# Patient Record
Sex: Male | Born: 1960 | Race: White | Hispanic: No | Marital: Married | Smoking: Never smoker
Health system: Southern US, Community
[De-identification: ages and names within clinical notes are randomized; demographics above are authoritative.]

---

## 1982-04-05 HISTORY — PX: KNEE SURGERY: SHX244

## 2006-04-14 ENCOUNTER — Encounter: Payer: Self-pay | Admitting: Internal Medicine

## 2006-04-27 ENCOUNTER — Encounter: Payer: Self-pay | Admitting: Internal Medicine

## 2007-08-30 ENCOUNTER — Encounter: Payer: Self-pay | Admitting: Internal Medicine

## 2008-10-21 ENCOUNTER — Encounter: Payer: Self-pay | Admitting: Internal Medicine

## 2008-10-24 ENCOUNTER — Encounter: Payer: Self-pay | Admitting: Internal Medicine

## 2009-11-21 ENCOUNTER — Encounter: Payer: Self-pay | Admitting: Internal Medicine

## 2009-11-25 ENCOUNTER — Encounter (INDEPENDENT_AMBULATORY_CARE_PROVIDER_SITE_OTHER): Payer: Self-pay | Admitting: *Deleted

## 2009-11-25 ENCOUNTER — Ambulatory Visit: Payer: Self-pay | Admitting: Internal Medicine

## 2009-11-25 DIAGNOSIS — I472 Ventricular tachycardia: Secondary | ICD-10-CM

## 2009-11-25 DIAGNOSIS — R6884 Jaw pain: Secondary | ICD-10-CM | POA: Insufficient documentation

## 2009-11-27 ENCOUNTER — Telehealth (INDEPENDENT_AMBULATORY_CARE_PROVIDER_SITE_OTHER): Payer: Self-pay | Admitting: *Deleted

## 2009-11-28 LAB — CONVERTED CEMR LAB
BUN: 14 mg/dL (ref 6–23)
CO2: 25 meq/L (ref 19–32)
CRP, High Sensitivity: 0.71 (ref 0.00–5.00)
Chloride: 108 meq/L (ref 96–112)
Creatinine, Ser: 0.9 mg/dL (ref 0.4–1.5)

## 2009-12-10 ENCOUNTER — Ambulatory Visit (HOSPITAL_COMMUNITY): Admission: RE | Admit: 2009-12-10 | Discharge: 2009-12-10 | Payer: Self-pay | Admitting: Cardiovascular Disease

## 2009-12-10 ENCOUNTER — Ambulatory Visit: Payer: Self-pay | Admitting: Cardiovascular Disease

## 2009-12-10 ENCOUNTER — Ambulatory Visit: Payer: Self-pay

## 2009-12-10 ENCOUNTER — Ambulatory Visit: Payer: Self-pay | Admitting: Internal Medicine

## 2009-12-18 ENCOUNTER — Telehealth: Payer: Self-pay | Admitting: Internal Medicine

## 2009-12-18 ENCOUNTER — Ambulatory Visit: Payer: Self-pay | Admitting: Internal Medicine

## 2009-12-23 ENCOUNTER — Encounter: Payer: Self-pay | Admitting: Internal Medicine

## 2009-12-23 LAB — CONVERTED CEMR LAB
BUN: 12 mg/dL (ref 6–23)
Bilirubin, Direct: 0.2 mg/dL (ref 0.0–0.3)
CO2: 28 meq/L (ref 19–32)
Calcium: 9.2 mg/dL (ref 8.4–10.5)
Cholesterol: 183 mg/dL (ref 0–200)
Creatinine, Ser: 0.8 mg/dL (ref 0.4–1.5)
HDL: 32.5 mg/dL — ABNORMAL LOW (ref >39.00)
LDL Cholesterol: 128 mg/dL — ABNORMAL HIGH (ref 0–99)
Total CHOL/HDL Ratio: 6
Total Protein: 7.1 g/dL (ref 6.0–8.3)
Triglycerides: 115 mg/dL (ref 0.0–149.0)

## 2010-02-18 ENCOUNTER — Encounter: Payer: Self-pay | Admitting: Internal Medicine

## 2010-02-18 ENCOUNTER — Ambulatory Visit: Payer: Self-pay | Admitting: Internal Medicine

## 2010-03-17 ENCOUNTER — Encounter: Payer: Self-pay | Admitting: Internal Medicine

## 2010-05-05 NOTE — Letter (Signed)
Summary: [facility]  [facility]   Imported By: Marylou Mccoy 12/25/2009 16:43:51  _____________________________________________________________________  External Attachment:    Type:   Image     Comment:   External Document

## 2010-05-05 NOTE — Letter (Signed)
Summary: Custom - Lipid  [facility], Main Office  [address]   [place], [place] [address]   Phone: [phone]  Fax: [phone]     December 23, 2009 MRN: [id number]   [facility] [address] [place], [place]  [address]   Dear James Stone,  We have reviewed your cholesterol results.  They are as follows:     Total Cholesterol:    183 (Desirable: less than 200)       HDL  Cholesterol:     32.50  (Desirable: greater than 40 for men and 50 for women)       LDL Cholesterol:       128  (Desirable: less than 100 for low risk and less than 70 for moderate to high risk)       Triglycerides:       115.0  (Desirable: less than 150)  Our recommendations include:  Looks ok, since you have just started on Crestor we will contact you in about 3 months to recheck labs.   Call our office at the number listed above if you have any questions.  Lowering your LDL cholesterol is important, but it is only one of a large number of "risk factors" that may indicate that you are at risk for heart disease, stroke or other complications of hardening of the arteries.  Other risk factors include:   A.  Cigarette Smoking* B.  High Blood Pressure* C.  Obesity* D.   Low HDL Cholesterol (see yours above)* E.   Diabetes Mellitus (higher risk if your is uncontrolled) F.  Family history of premature heart disease G.  Previous history of stroke or cardiovascular disease    *These are risk factors YOU HAVE CONTROL OVER.  For more information, visit .  There is now evidence that lowering the TOTAL CHOLESTEROL AND LDL CHOLESTEROL can reduce the risk of heart disease.  The American Heart Association recommends the following guidelines for the treatment of elevated cholesterol:  1.  If there is now current heart disease and less than two risk factors, TOTAL CHOLESTEROL should be less than 200 and LDL CHOLESTEROL should be less than 100. 2.  If there is current heart disease or two  or more risk factors, TOTAL CHOLESTEROL should be less than 200 and LDL CHOLESTEROL should be less than 70.  A diet low in cholesterol, saturated fat, and calories is the cornerstone of treatment for elevated cholesterol.  Cessation of smoking and exercise are also important in the management of elevated cholesterol and preventing vascular disease.  Studies have shown that 30 to 60 minutes of physical activity most days can help lower blood pressure, lower cholesterol, and keep your weight at a healthy level.  Drug therapy is used when cholesterol levels do not respond to therapeutic lifestyle changes (smoking cessation, diet, and exercise) and remains unacceptably high.  If medication is started, it is important to have you levels checked periodically to evaluate the need for further treatment options.  Thank you,    [employer] Team

## 2010-05-05 NOTE — Progress Notes (Addendum)
  Phone Note Outgoing Call   Details for Reason: Promise study Summary of Call: Pt was notified that coronary CTA was scheduled for 12/10/2009. Pt was given instructions and verbalized understanding.

## 2010-05-05 NOTE — Assessment & Plan Note (Signed)
Summary: James Stone   Visit Type:  Follow-up Primary Provider:  DR James Stone  CC:  no complaints.  History of Present Illness: James Stone is a 50 y/o Stone with a h/o hyperlipidemia, erectile dysfunction and exertional non-sustained ventricular tachycardia. Referred to me by James Stone for 2nd opinion.    Had ETT in 04/14/2006 with his PCP which was negative except for 3 short runs (5-6 beats) of NSVT. Started on Atenolol. Referred to James Stone (Dr. Deeann Stone) and had a follow-up Adenosine Myoview in 1/08 at [facility]. EF 64%. No ischemia or infarct. He continued to have paroxysms of exertion throat discomfort and was offered a cardiac cath but has never pursue this.   Over past few months has had normal stress test (11:30 on Bruce) with PVCs. No NSVT. No ST-T abnormalities. Also had coronary CT which showed:  1)    EF 54% No RWMAs 2)    Right dominant circulation with no significant stenosis. Less than 30% calcified plaque in proximal circumflex  Doing great. Exercise as permitted with his plantar fasciitis. No CP or SOB. Stopped Crestor due to price.     Current Medications (verified): 1)  Vitamin C 500 Mg  Tabs (Ascorbic Acid) .Marland Kitchen.. 1 Tab Once Daily 2)  Fish Oil   Oil (Fish Oil) .Marland Kitchen.. 1 Tab Two Times A Day 3)  Ibuprofen 200 Mg Tabs (Ibuprofen) .... 4 Tabs Od 4)  Viagra 100 Mg Tabs (Sildenafil Citrate) .... As Needed  Allergies (verified): No Known Drug Allergies  Past History:  Past Medical History: 1) Brief runs of NSVT on ETT 1/08     --f/u adenosine myoview 1/08. EF normal. no ischemia 2) FHx CAD 3) Erectile dysfunction 4) Cardiac CT 9/11:      --EF 54% No RWMAs     -- Right dominant circulation with no significant stenosis.        Less than 30% calcified plaque in proximal circumflex    --ETT: Bruce 11:30 no ST changes. PVCs. no NSVT       Review of Systems       As per HPI and past medical history; otherwise all systems negative.   Vital Signs:  Patient  profile:   50 year old Stone Height:      76 inches Weight:      232 pounds BMI:     28.34 Pulse rate:   57 / minute BP sitting:   108 / 70  (left arm) Cuff size:   regular  Vitals Entered By: James Stone, RMA (February 18, 2010 11:13 AM)  Physical Exam  General:  Well appearing. no resp difficulty HEENT: normal Neck: supple. no JVD. Carotids 2+ bilat; no bruits. No lymphadenopathy or thryomegaly appreciated. Cor: PMI nondisplaced. Regular rate & rhythm. No rubs, gallops, murmur. Lungs: clear Abdomen: soft, nontender, nondistended. No hepatosplenomegaly. No bruits or masses. Good bowel sounds. Extremities: no cyanosis, clubbing, rash, edema Neuro: alert & orientedx3, cranial nerves grossly intact. moves all 4 extremities w/o difficulty. affect pleasant    Impression & Recommendations:  Problem # 1:  Chest pain/NSVT Stress testing and cardiac CT look great. Reassured him. No further testing at this point.   Problem # 2:  Hyperlipidemia LDL just mildly elevated. Will switch Crestor to simva 20.   Other Orders: EKG w/ Interpretation (93000)  Patient Instructions: 1)  Your physician recommends that you schedule a follow-up appointment in: 12 months 2)  Your physician recommends that you return for a FASTING lipid profile, liver profile and  complete metabolic profile in 4 months--March 2012. 3)  Your physician has recommended you make the following change in your medication: Stop Crestor  Appended Document: Clawson Stone Per Dr. Gala Stone pt to start on Simvastatin 20 mg daily. I spoke with pt and he is aware of this. [facility] [place] [facility].   Clinical Lists Changes  Medications: Added new medication of SIMVASTATIN 20 MG TABS (SIMVASTATIN) Take one tablet by mouth daily at bedtime - Signed Rx of SIMVASTATIN 20 MG TABS (SIMVASTATIN) Take one tablet by mouth daily at bedtime;  #30 x 11;  Signed;  Entered by: James Arbour, RN, BSN;   Authorized by: James Patty, MD, [facility];  Method used: [facility]  [phone] [address], [place], [place]  [address], Ph: [phone] or [phone], Fax: [phone]    Prescriptions: SIMVASTATIN 20 MG TABS (SIMVASTATIN) Take one tablet by mouth daily at bedtime  #30 x 11   Entered by:   James Arbour, RN, BSN   Authorized by:   James Patty, MD, [facility]   Signed by:   James Arbour, RN, BSN on 02/18/2010   Method used:   [facility]        [facility]  [address] [id number]       [address]       [place], [place]  [address]       Ph: [phone] or [phone]       Fax: [phone]   RxID:   [id number]

## 2010-05-05 NOTE — Assessment & Plan Note (Signed)
Summary: cardiac evaluation   Visit Type:  Initial Consult Primary Provider:  DR Corine Shelter  CC:  pt WANTS TO BE SURE HE IS OK AS FAR AS HIS HEART -NO CARDIAC COMPLAINTS.  History of Present Illness: James Stone is a 50 y/o male with a h/o hyperlipidemia, erectile dysfunction and exertional non-sustained ventricular tachycardia. Referred to me by Timoteo Ace for 2nd opinion.    Had ETT in 04/14/2006 with his PCP which was negative except for 3 short runs (5-6 beats) of NSVT. Started on Atenolol. Referred to [facility] (Dr. Deeann Cree) and had a follow-up Adenosine Myoview in 1/08 at [facility]. EF 64%. No ischemia or infarct. He continued to have paroxysms of exertion throat discomfort and was offered a cardiac cath but has never pursue this. Has not had an echo.   Previously a major league baseball player. But has essentially stopped exercising for the past 3 years due to his fear of SCD. 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 No current jaw or chest pain but hasn't pushed himself hard enough. Feels like these symptoms might come back if he did. . Denies palpitations or syncope.     Current Medications (verified): 1)  Atenolol 25 Mg Tabs (Atenolol) .Marland Kitchen.. 1 Tab Once Daily 2)  Viagra .... As Directed 3)  Vitamin E 600 Unit  Caps (Vitamin E) .Marland Kitchen.. 1 Tab Qds 4)  Vitamin C 500 Mg  Tabs (Ascorbic Acid) .Marland Kitchen.. 1 Tab Once Daily 5)  Fish Oil   Oil (Fish Oil) .Marland Kitchen.. 1 Tab Two Times A Day 6)  Ibuprofen 200 Mg Tabs (Ibuprofen) .... 4 Tabs Od  Allergies (verified): No Known Drug Allergies  Past History:  Family History: Last updated: 2009/12/17 Mother died lung CA (smoker) Father smoker had CAD in his 8s s/p PCI and CABG 2 sisters smokers. No CAD  Social History: Last updated: 12-17-2009 Former major league Print production planner. Salesperson. Married with 3 kids. No tobacco.   Past Medical History: 1) Brief runs of NSVT on ETT 1/08     --f/u  adenosine myoview 1/08. EF normal. no ischemia 2) FHx CAD 3) Erectile dysfunction        Family History: Reviewed history and no changes required. Mother died lung CA (smoker) Father smoker had CAD in his 76s s/p PCI and CABG 2 sisters smokers. No CAD  Social History: Reviewed history and no changes required. Former major Careers information officer. Salesperson. Married with 3 kids. No tobacco.   Review of Systems       As per HPI and past medical history; otherwise all systems negative.   Vital Signs:  Patient profile:   50 year old male Height:      76 inches Weight:      238 pounds BMI:     29.07 Pulse rate:   60 / minute BP sitting:   131 / 81  (left arm) Cuff size:   regular  Vitals Entered By: Burnett Kanaris, CNA (2009/12/17 3:22 PM)  Physical Exam  General:  Well appearing. no resp difficulty HEENT: normal Neck: supple. no JVD. Carotids 2+ bilat; no bruits. No lymphadenopathy or thryomegaly appreciated. Cor: PMI nondisplaced. Regular rate & rhythm. No rubs, gallops, murmur. Lungs: clear Abdomen: soft, nontender, nondistended. No hepatosplenomegaly. No bruits or masses. Good bowel sounds. Extremities: no cyanosis, clubbing, rash, edema Neuro: alert & orientedx3, cranial nerves grossly intact. moves all 4 extremities w/o difficulty. affect pleasant    Impression & Recommendations:  Problem # 1:  VENTRICULAR TACHYCARDIA (ICD-427.1) I suspect this is likely a predominant electrical issue but given his exertional jaw pain we also have to rule out underlying CAD. Will randomize into Promise Trial to rule out ischemia with stress test or cardiac CT. Will also need to walk on treadmill at some point to make sure he does not continue to have -mediated VT. If evidence of VT. If no evidence of CAD will place montior and possible refer to EP. If does not get cardiac CT will need 2-D echo to look at cardiac structure. Over an hour sepnt reviewing extensive chart from  referring and discussing options with patient.   Problem # 2:  JAW PAIN (ICD-784.92) As above randomize into Promise Trial.   Other Orders: EKG w/ Interpretation (93000) TLB-BMP (Basic Metabolic Panel-BMET) (80048-METABOL) TLB-TSH (Thyroid Stimulating Hormone) (84443-TSH) TLB-CRP-High Sensitivity (C-Reactive Protein) (86140-FCRP) TLB-Magnesium (Mg) (83735-MG)  Patient Instructions: 1)  Your physician recommends that you schedule a follow-up appointment in: 4- 6 weeks. 2)  Your physician recommends that you have lab work today: bmet/magnesium/tsh/crp 3)  You are being enrolled in the PROMISE trial. 4)  We will contact you about which test you have been randomized to- If you are randomized to a Cardiac CT, then we will also order a treadmill test with Dr. Gala Romney.  Appended Document: cardiac evaluation Pt randamized to Cardiac CTA, order placed    Clinical Lists Changes  Orders: Added new Referral order of Cardiac CTA (Cardiac CTA) - Signed

## 2010-05-05 NOTE — Letter (Signed)
Summary: NNP  NNP   Imported By: Marylou Mccoy 12/05/2009 10:48:31  _____________________________________________________________________  External Attachment:    Type:   Image     Comment:   External Document

## 2010-05-05 NOTE — Progress Notes (Signed)
Summary: labs and med order  ---- Converted from flag ---- ---- 12/16/2009 2:24 PM, Dolores Patty, MD, [facility] wrote: please bring him in for CMET and lipids on Thurs 9/15. Also call in crestor 10 and viagra 100 x 10. [facility] [place] [place]  thanks-dan ------------------------------       New/Updated Medications: CRESTOR 10 MG TABS (ROSUVASTATIN CALCIUM) Take one tablet by mouth daily. VIAGRA 100 MG TABS (SILDENAFIL CITRATE) as needed Prescriptions: VIAGRA 100 MG TABS (SILDENAFIL CITRATE) as needed  #10 x 5   Entered by:   Meredith Staggers, RN   Authorized by:   Dolores Patty, MD, [facility]   Signed by:   Meredith Staggers, RN on 12/18/2009   Method used:   [facility]  [address] [id number]       [address]       [place], [place]  [address]       Ph: [phone] or [phone]       Fax: [phone]   RxID:   [id number] CRESTOR 10 MG TABS (ROSUVASTATIN CALCIUM) Take one tablet by mouth daily.  #30 x 6   Entered by:   Meredith Staggers, RN   Authorized by:   Dolores Patty, MD, [facility]   Signed by:   Meredith Staggers, RN on 12/18/2009   Method used:   [facility]  [address] [id number]       [address]       [place], [place]  [address]       Ph: [phone] or [phone]       Fax: [phone]   RxID:   [id number]

## 2010-05-07 NOTE — Letter (Signed)
Summary: [facility]  [facility]   Imported By: Marylou Mccoy 04/22/2010 10:04:58  _____________________________________________________________________  External Attachment:    Type:   Image     Comment:   External Document

## 2010-05-07 NOTE — Letter (Signed)
Summary: [facility]  [facility]   Imported By: Marylou Mccoy 04/22/2010 10:10:16  _____________________________________________________________________  External Attachment:    Type:   Image     Comment:   External Document

## 2010-05-07 NOTE — Letter (Signed)
Summary: lipid reminder   [facility], Main Office  [address]   [place], [place] [address]   Phone: [phone]  Fax: [phone]        March 17, 2010 MRN: [id number]    [facility] [address] [place], [place]  [address]    Dear Mr. Morriss,  Our records indicate it is time to check your cholesterol, due to your recent medication change.  Please call our office to schedule an appt for labwork.  Please remember it is a fasting lab.     Sincerely,  Meredith Staggers, RN Arvilla Meres, MD This letter has been electronically signed by your physician.  Appended Document: lipid reminder letter not mailed, done in error

## 2010-12-11 ENCOUNTER — Other Ambulatory Visit: Payer: Self-pay

## 2010-12-11 ENCOUNTER — Telehealth: Payer: Self-pay

## 2010-12-11 DIAGNOSIS — N529 Male erectile dysfunction, unspecified: Secondary | ICD-10-CM

## 2010-12-11 MED ORDER — SILDENAFIL CITRATE 100 MG PO TABS: 100.0000 mg | ORAL_TABLET | Freq: Every day | ORAL | Status: DC | PRN

## 2010-12-11 NOTE — Telephone Encounter (Signed)
Pt requesting refill of viagra 

## 2011-06-17 ENCOUNTER — Telehealth: Payer: Self-pay | Admitting: Internal Medicine

## 2011-06-17 ENCOUNTER — Other Ambulatory Visit (HOSPITAL_COMMUNITY): Payer: Self-pay | Admitting: Internal Medicine

## 2011-06-17 NOTE — Telephone Encounter (Signed)
New Msg: Pt calling needing a refill of Viagra however pt pharmacy stated that prior to pt refill request being filled pt needs to see MD. Please return pt call to discuss if Dr. Gala Romney would like to see pt in CHF clinic or if pt care needs to be transferred to another MD. Please return pt call to discuss further.

## 2011-06-17 NOTE — Telephone Encounter (Signed)
James Stone. RN working with Dr Gala Romney today, will forward to her so she can discuss with him

## 2011-06-18 MED ORDER — SILDENAFIL CITRATE 100 MG PO TABS: 100.0000 mg | ORAL_TABLET | Freq: Every day | ORAL | Status: DC | PRN

## 2011-06-18 NOTE — Telephone Encounter (Signed)
Spoke with pt, he was under the impression he did not need to see dr bensimhon again. Explained to pt the last office note says to f/u in one year. The pt is not having any problems. Dr bensimhon gave the okay to give the pt refills. The pt is going to check with his insurance and will decide if he is coming back to cardiology or will get his viagra  From his primary care. Pt made aware of our refill policy.

## 2011-06-29 ENCOUNTER — Telehealth: Payer: Self-pay | Admitting: Internal Medicine

## 2011-06-29 NOTE — Telephone Encounter (Signed)
FU Call: Pt returning call from Debra from yesterday regarding pt appt with Dr. Gala Romney. Please return pt call to discuss further.

## 2011-06-29 NOTE — Telephone Encounter (Signed)
F/U   Patient returning nurse DM call, he can be reached at [phone] regarding an appnt with Dr. Gala Romney.  Patient would like to speak with [facility]

## 2011-06-29 NOTE — Telephone Encounter (Signed)
Spoke with pt, he would like to see dr bensimhon. The number to the heart failure clinic given.

## 2011-08-04 ENCOUNTER — Ambulatory Visit (HOSPITAL_COMMUNITY)
Admission: RE | Admit: 2011-08-04 | Discharge: 2011-08-04 | Disposition: A | Discharge status: Home / Self Care | Payer: BC Managed Care – PPO | Source: Ambulatory Visit | Attending: Cardiology | Admitting: Cardiology

## 2011-08-04 ENCOUNTER — Ambulatory Visit (HOSPITAL_COMMUNITY)
Admission: RE | Admit: 2011-08-04 | Discharge: 2011-08-04 | Disposition: A | Discharge status: Home / Self Care | Payer: BC Managed Care – PPO | Source: Ambulatory Visit | Attending: Internal Medicine | Admitting: Internal Medicine

## 2011-08-04 VITALS — BP 146/82 | HR 73 | Wt 239.5 lb

## 2011-08-04 DIAGNOSIS — I4729 Other ventricular tachycardia: Secondary | ICD-10-CM | POA: Insufficient documentation

## 2011-08-04 DIAGNOSIS — I472 Ventricular tachycardia, unspecified: Secondary | ICD-10-CM | POA: Insufficient documentation

## 2011-08-10 ENCOUNTER — Encounter (HOSPITAL_COMMUNITY): Payer: Self-pay

## 2011-08-10 ENCOUNTER — Ambulatory Visit (HOSPITAL_COMMUNITY)
Admission: RE | Admit: 2011-08-10 | Discharge: 2011-08-10 | Disposition: A | Discharge status: Home / Self Care | Payer: BC Managed Care – PPO | Source: Ambulatory Visit | Attending: Internal Medicine | Admitting: Internal Medicine

## 2011-08-10 VITALS — BP 118/74 | HR 68 | Resp 18 | Ht 76.0 in | Wt 239.0 lb

## 2011-08-10 DIAGNOSIS — I472 Ventricular tachycardia, unspecified: Secondary | ICD-10-CM | POA: Insufficient documentation

## 2011-08-10 DIAGNOSIS — N529 Male erectile dysfunction, unspecified: Secondary | ICD-10-CM | POA: Insufficient documentation

## 2011-08-10 DIAGNOSIS — E785 Hyperlipidemia, unspecified: Secondary | ICD-10-CM | POA: Insufficient documentation

## 2011-08-10 DIAGNOSIS — I4729 Other ventricular tachycardia: Secondary | ICD-10-CM | POA: Insufficient documentation

## 2011-08-10 DIAGNOSIS — I2510 Atherosclerotic heart disease of native coronary artery without angina pectoris: Secondary | ICD-10-CM | POA: Insufficient documentation

## 2011-08-10 NOTE — Patient Instructions (Signed)
We will contact you in 12 months to schedule your next appointment.  

## 2011-08-10 NOTE — Progress Notes (Signed)
Patient ID: James Stone, male   DOB: [birth year], 51 y.o.   MRN: [id number]  HPI:  James Stone is a 51 y/o male with a h/o hyperlipidemia, erectile dysfunction and exertional non-sustained ventricular tachycardia. Had ETT in 04/14/2006 with his PCP which was negative except for 3 short runs (5-6 beats) of NSVT. Started on Atenolol. Had a follow-up Adenosine Myoview in 1/08 at [facility]. EF 64%. No ischemia or infarct. He continued to have paroxysms of exertion throat discomfort and was offered a cardiac cath but has never pursue this.   Had normal stress test (11:30 on [facility]) with PVCs. No NSVT. No ST-T abnormalities. Also had coronary CT in 9/11 which showed:   1) EF 54% No RWMAs  2) Right dominant circulation with no significant stenosis.  Less than 30% calcified plaque in proximal circumflex   Had kidney stone a few months ago. Frustrated about his weight. Wants to lose 20 pounds. Denies CP or SOB. Struggling to get back in shape.   ROS: All systems negative except as listed in HPI, PMH and Problem List.  History reviewed. No pertinent past medical history.  Current Outpatient Prescriptions  Medication Sig Dispense Refill  . sildenafil (VIAGRA) 100 MG tablet Take 100 mg by mouth daily as needed.      Marland Kitchen DISCONTD: sildenafil (VIAGRA) 100 MG tablet Take 1 tablet (100 mg total) by mouth daily as needed for erectile dysfunction.  10 tablet  3  . DISCONTD: sildenafil (VIAGRA) 100 MG tablet Take 1 tablet (100 mg total) by mouth daily as needed for erectile dysfunction.  10 tablet  1     PHYSICAL EXAM: Filed Vitals:   08/10/11 1144  BP: 118/74  Pulse: 68  Resp: 18  General:  Well appearing. No resp difficulty HEENT: normal Neck: supple. JVP flat. Carotids 2+ bilaterally; no bruits. No lymphadenopathy or thryomegaly appreciated. Cor: PMI normal. Regular rate & rhythm. No rubs, gallops or murmurs. Lungs: clear Abdomen: soft, nontender, nondistended. No hepatosplenomegaly. No bruits or masses.  Good bowel sounds. Extremities: no cyanosis, clubbing, rash, edema Neuro: alert & orientedx3, cranial nerves grossly intact. Moves all 4 extremities w/o difficulty. Affect pleasant.   ASSESSMENT & PLAN:

## 2011-08-11 ENCOUNTER — Telehealth (HOSPITAL_COMMUNITY): Payer: Self-pay | Admitting: *Deleted

## 2011-08-11 NOTE — Telephone Encounter (Signed)
Mr Daughdrill called today. He went to his PCP and was told that they cannot do his cholesterol screening there, that we will need to do it.  Can you put an order in for him at [facility] and call him with the details.  Thanks.

## 2011-08-11 NOTE — Telephone Encounter (Signed)
Left message to call back  

## 2011-08-12 NOTE — Telephone Encounter (Signed)
Spoke w/pt he will go to a [place] lab near his home

## 2011-08-13 DIAGNOSIS — E785 Hyperlipidemia, unspecified: Secondary | ICD-10-CM | POA: Insufficient documentation

## 2011-08-13 DIAGNOSIS — I2510 Atherosclerotic heart disease of native coronary artery without angina pectoris: Secondary | ICD-10-CM | POA: Insufficient documentation

## 2011-08-13 NOTE — Assessment & Plan Note (Signed)
Discussed role of statins in preventing progression of atherosclerosis and also soft plaque stabilization. Given mild CAD, goal LDL at least < 100. Will also check CRP. If either elevated will start statin.

## 2011-08-13 NOTE — Assessment & Plan Note (Signed)
Very mild by CT. Discussed need for aggressive RF modification and continuing exercise program.

## 2011-08-24 ENCOUNTER — Encounter (HOSPITAL_COMMUNITY): Payer: Self-pay

## 2011-08-24 ENCOUNTER — Encounter (HOSPITAL_COMMUNITY): Payer: Self-pay | Admitting: *Deleted

## 2011-09-23 ENCOUNTER — Other Ambulatory Visit (HOSPITAL_COMMUNITY): Payer: Self-pay | Admitting: *Deleted

## 2011-09-23 MED ORDER — SILDENAFIL CITRATE 100 MG PO TABS: 100.0000 mg | ORAL_TABLET | Freq: Every day | ORAL | Status: DC | PRN

## 2011-09-26 IMAGING — CT CT HEART MORP W/ CTA COR W/ SCORE W/ CA W/CM &/OR W/O CM
2 of 4 series · 14 of 20 positions shown, 16 images · non-contrast
Comparison: none

Cardiac CT:
INDICATION: VT and Chest Pain
PROTOCOL: The patient was scanned on a Siemens Sensation 64 slice
scanner.  Gantry rotation speed was 320 msec.  Collimation was [DATE]m with a reconstruction overlap of .4mm.  The patient received
7.5 mg of iv lopresser and sl nitro.  A 120 kv dose modulated
retrospective protocol was used.  Average HR during the scan was 60
bpm.  After an initial AP and lateral topogram, 3 mm non contrast
axial slices were done for calcium scoring using the Agatson
method.  The patient received 20cc of contrast for a timing bolus
with JEREMI in the ascending aorta.  80 cc of contrast was used for
CTA.  The 3D data set was sent to a [HOSPITAL] work-station for
reconstruction using MIP, VRT and MPR modes

[Series 7: axial · axial · 0.43mm/px · z∈[+1242,+1348]mm · 6 of 299 slices shown, 8 images]
[im 43/299  vessel]
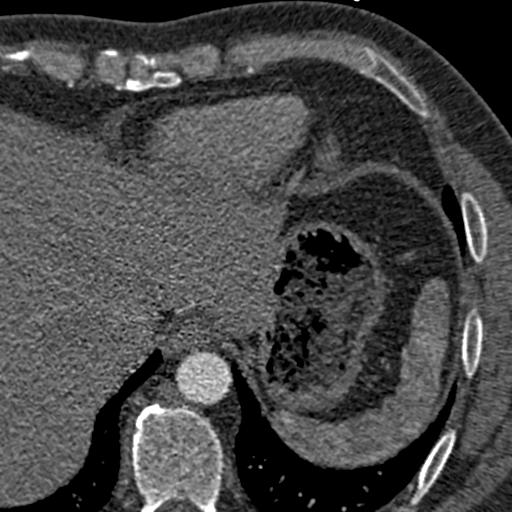
[im 43/299  lung]
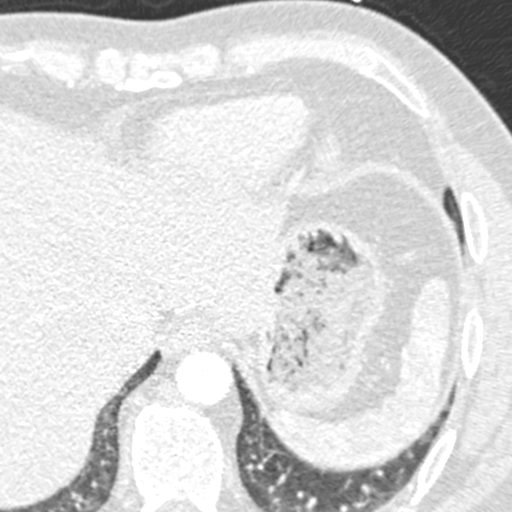
[im 86/299  vessel]
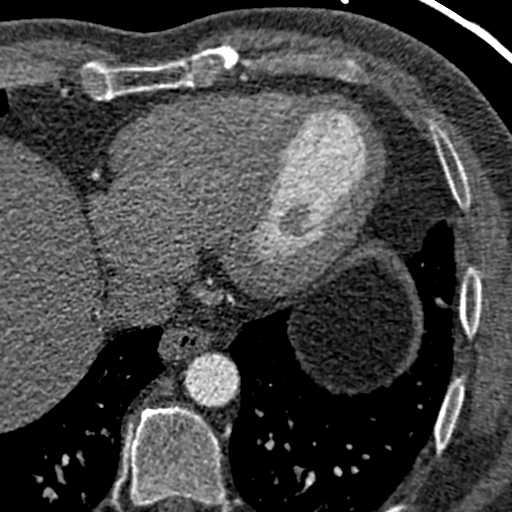
[im 128/299  vessel]
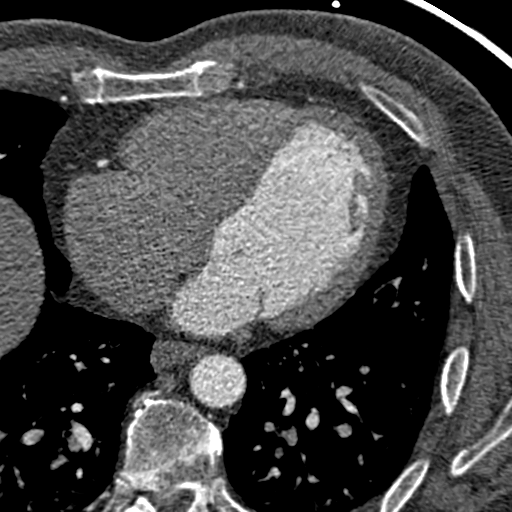
[im 171/299  vessel]
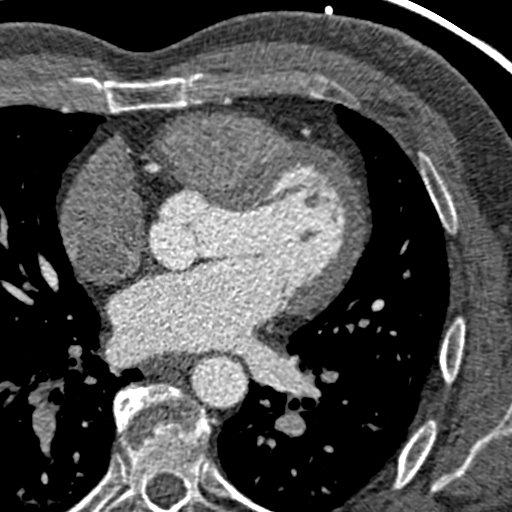
[im 213/299  vessel]
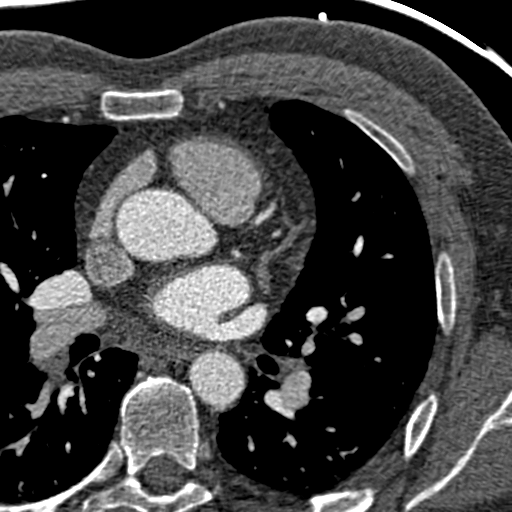
[im 213/299  lung]
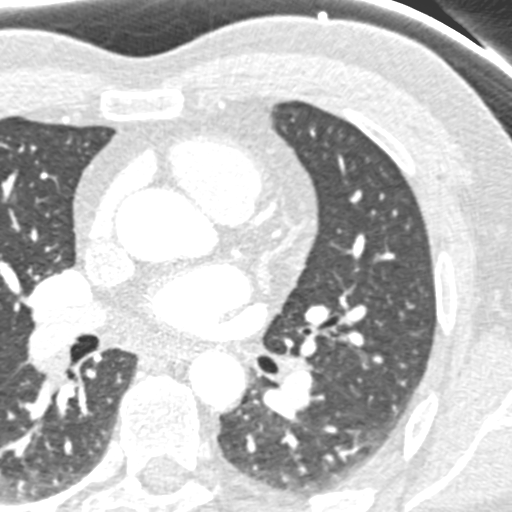
[im 256/299  vessel]
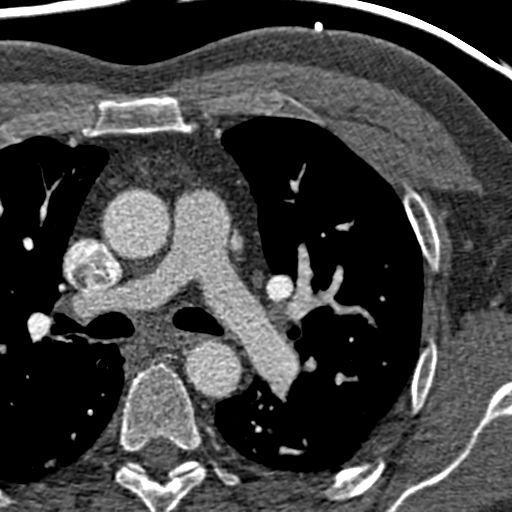

[Series 12: 70% only · axial · 0.37mm/px · z∈[+1237,+1353]mm · 8 of 373 slices shown]
[im 42/373  vessel]
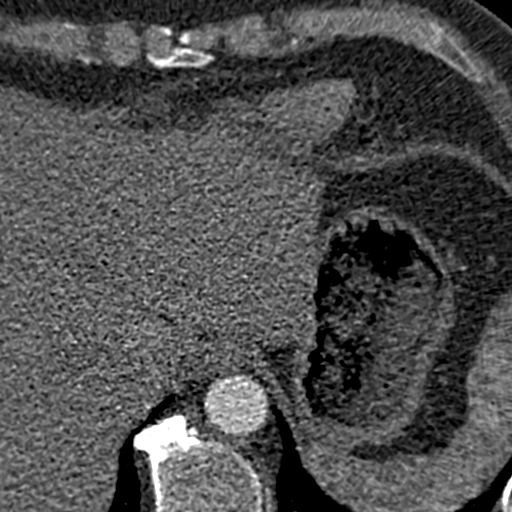
[im 83/373  vessel]
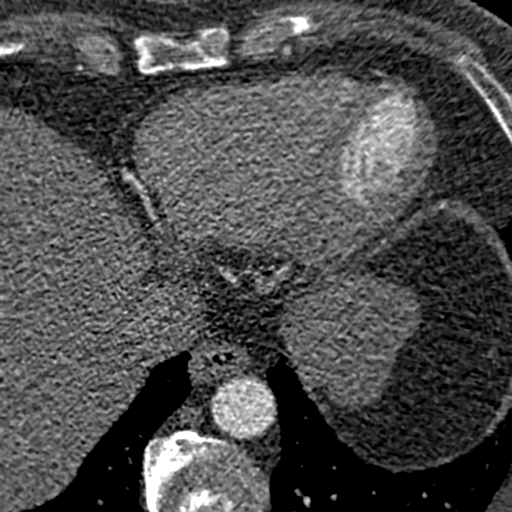
[im 125/373  vessel]
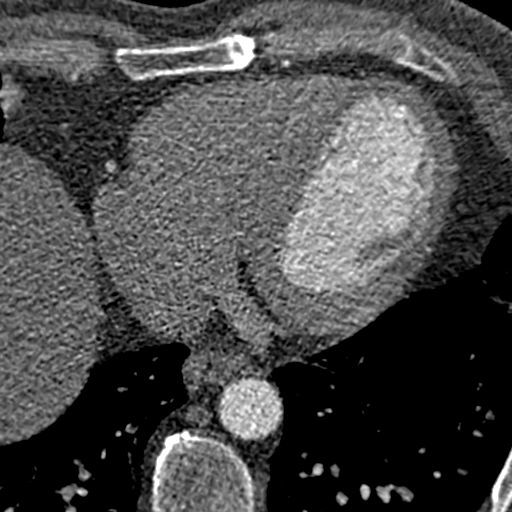
[im 166/373  vessel]
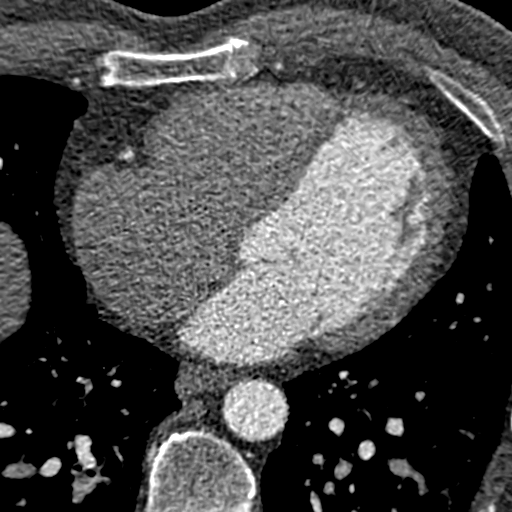
[im 207/373  vessel]
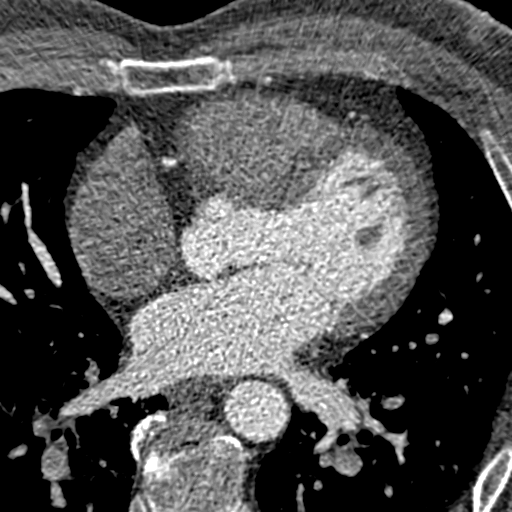
[im 249/373  vessel]
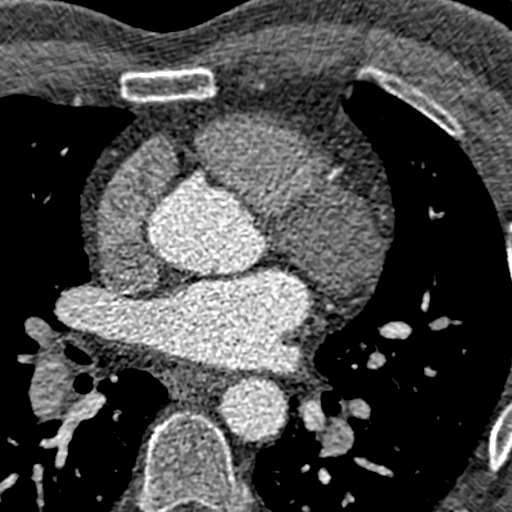
[im 290/373  vessel]
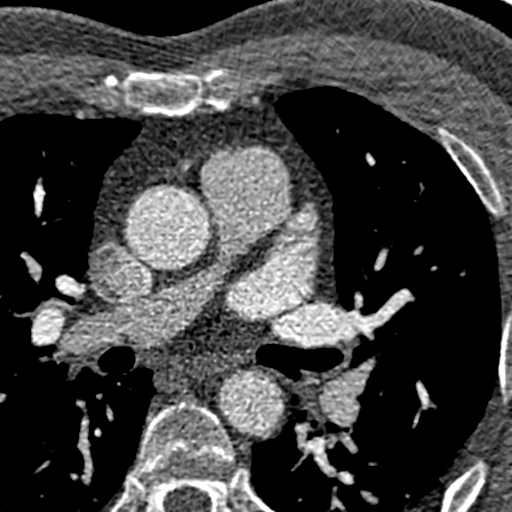
[im 331/373  vessel]
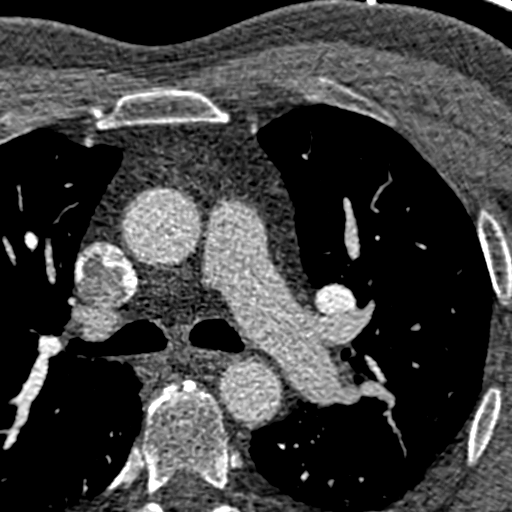

[14 of 20 positions shown; findings below may reference images not displayed]

Noncardiac Findings:  See separate report from [REDACTED].  No significant lung or soft tissue findings.  To my eye
the pulmonary vasculature was prominent.

Cardiac Findings:  Coronary calcium score = 4 with a small focus of
calcium in the proximal circumflex.  Right dominant circulation.
No coronary anomalies.  LM: normal. LAD: normal D1: normal D2:
normal Circumflex: Less than 30% calcified lesion in proximal
circumflex.  OM1: normal.  RCA: dominant and normal  EF 54% with no
RWMA's.

All 4 cardiac chambers appeared normal.  There was no VSD or ASD.
AV was trileaflet.  No pericardial effusion.  All 4 pulmonary veins
drained normally into the LA.
IMPRESSION: 1)    No significant soft tissue or lung findings.  See separate
      report from [REDACTED].  ? Prominent pulmonary
      vasculature

2)    Coronary Calcium score 4 with small discrete focus in
proximal circumflex
3)    EF 54% No RWMAs
4)    Right dominant circulation with no significant stenosis.
Less than 30% calcified plaque in proximal circumflex

## 2012-08-30 ENCOUNTER — Other Ambulatory Visit (HOSPITAL_COMMUNITY): Payer: Self-pay | Admitting: *Deleted

## 2012-08-30 MED ORDER — SILDENAFIL CITRATE 100 MG PO TABS: 100.0000 mg | ORAL_TABLET | Freq: Every day | ORAL | Status: DC | PRN

## 2013-09-24 ENCOUNTER — Telehealth (HOSPITAL_COMMUNITY): Payer: Self-pay | Admitting: Vascular Surgery

## 2013-09-24 ENCOUNTER — Other Ambulatory Visit (HOSPITAL_COMMUNITY): Payer: Self-pay

## 2013-09-24 MED ORDER — SILDENAFIL CITRATE 100 MG PO TABS: 100.0000 mg | ORAL_TABLET | Freq: Every day | ORAL | Status: DC | PRN

## 2013-09-24 NOTE — Telephone Encounter (Signed)
Pt needs refill on Viagra 100 mg 1 year ran out in May 2015, PLEASE call [facility] [place] .

## 2013-09-24 NOTE — Telephone Encounter (Signed)
Rx refill viagra sent, attempted to call patient to make aware, no answer.

## 2014-10-08 ENCOUNTER — Other Ambulatory Visit (HOSPITAL_COMMUNITY): Payer: Self-pay | Admitting: *Deleted

## 2014-10-10 ENCOUNTER — Other Ambulatory Visit (HOSPITAL_COMMUNITY): Payer: Self-pay | Admitting: *Deleted

## 2014-10-10 MED ORDER — SILDENAFIL CITRATE 100 MG PO TABS: 100.0000 mg | ORAL_TABLET | Freq: Every day | ORAL | Status: DC | PRN

## 2014-10-14 ENCOUNTER — Other Ambulatory Visit (HOSPITAL_COMMUNITY): Payer: Self-pay | Admitting: *Deleted

## 2014-10-14 MED ORDER — SILDENAFIL CITRATE 100 MG PO TABS: 100.0000 mg | ORAL_TABLET | Freq: Every day | ORAL | Status: DC | PRN

## 2015-10-22 ENCOUNTER — Other Ambulatory Visit (HOSPITAL_COMMUNITY): Payer: Self-pay | Admitting: Internal Medicine

## 2016-04-16 ENCOUNTER — Other Ambulatory Visit (HOSPITAL_COMMUNITY): Payer: Self-pay | Admitting: Internal Medicine
